# Patient Record
Sex: Female | Born: 1995 | Race: White | Hispanic: No | Marital: Single | Smoking: Current every day smoker
Health system: Southern US, Community
[De-identification: ages and names within clinical notes are randomized; demographics above are authoritative.]

---
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
## 2017-05-13 ENCOUNTER — Emergency Department
Admission: EM | Admit: 2017-05-13 | Discharge: 2017-05-13 | Disposition: A | Discharge status: Home / Self Care | Payer: Self-pay | Attending: Emergency Medicine | Admitting: Emergency Medicine

## 2017-05-13 DIAGNOSIS — F410 Panic disorder [episodic paroxysmal anxiety] without agoraphobia: Secondary | ICD-10-CM | POA: Insufficient documentation

## 2017-05-13 DIAGNOSIS — Z87891 Personal history of nicotine dependence: Secondary | ICD-10-CM | POA: Insufficient documentation

## 2017-05-13 NOTE — Discharge Instructions (Signed)
You have been seen in the Emergency Department (ED) today for chest pain.  We feel it is likely that panic attacks may be causing your symptoms.  Please follow up with the recommended doctor as instructed above in these documents regarding today?s emergent visit and your recent symptoms to discuss further management.   Return to the Emergency Department (ED) if you experience any further chest pain/pressure/tightness, difficulty breathing, or sudden sweating, or other symptoms that concern you.

## 2017-05-13 NOTE — ED Triage Notes (Signed)
Pt comes into the ED via [facility] from work c/o panic attack.  Patient states she is unsure what caused the initial panic attack and that she has numbness and tingling in her hands.  Patient presents at this time hyperventilating.  Patient had 3 episodes of syncope from the episode.  Patient now able to form full sentences and speak clearly.

## 2017-05-13 NOTE — ED Triage Notes (Signed)
Pt arrived via ems from work. EMS unsure of cause, episodes of passing out x 3. Locked hands, numbness and tingling, facial numbness and tingling. Hx of attacks in past. Pt in no obvious distress.

## 2017-05-13 NOTE — ED Provider Notes (Signed)
[facility] Emergency Department Provider Note  ____________________________________________   First MD Initiated Contact with Patient 05/13/17 1858     (approximate)  I have reviewed the triage vital signs and the nursing notes.   HISTORY  Chief Complaint Panic Attack    HPI Jennifer Faulkner is a 21 y.o. female with medical history as listed below who presents with her mother and grandmother for evaluation of tach.  She states she is she was having a normal day without any particular stressors or "bad things".  She was having a conversation with someone that was not particularly contentious when she started to feel short of breath and then started feeling like "my chest was exploding".  She was hyperventilating and started feeling numbness and tingling in her hands and feet.  When she arrived in triage she was having trouble walking because of the hyperventilation and was having some mild contractures of her hands and fingers.  She calmed down after arriving in triage and her symptoms started to resolve and she was able to speak in full sentences.  Reportedly she passed out several times while she was hyperventilating.  He was having some facial numbness.  All of the symptoms have completely resolved.  The onset was acute and the symptoms were severe.  Nothing in particular made them better nor worse, but everything improved dramatically and then resolved after she calmed down and stopped breathing so quickly.  She denies any recent abuse or "incidents".  She denies any recent drug use although she does admit that she smoked crack a few months ago but states she has been clean since that time.  She denies IV drug use.  She is not on any medications and has had no recent medication changes.  She had a psychiatrist in the past with whom she had a good rapport, but that psychiatrist moved away and she never developed a relationship with anyone else after that.  She is  living with her grandmother currently and working at a daycare facility and there is some stress as a result of that situation, but her family is supportive and at the bedside with her.  She adamantly denies any suicidal ideation or thoughts of harming anyone else.  History reviewed. No pertinent past medical history.  Patient Active Problem List   Diagnosis Date Noted  . Acute toxic hepatitis 04/06/2015  . Hypokalemia 04/06/2015  . Marijuana abuse 04/06/2015  . Alcohol abuse 04/06/2015  . 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 Tylenol overdose 04/04/2015    History reviewed. No pertinent surgical history.  Prior to Admission medications   Medication Sig Start Date End Date Taking? Authorizing Provider  ondansetron (ZOFRAN ODT) 4 MG disintegrating tablet Take 1 tablet (4 mg total) by mouth every 8 (eight) hours as needed for nausea or vomiting. 04/05/15   Enid Baas, MD    Allergies Patient has no known allergies.  Family History  Problem Relation Age of Onset  . Brain cancer Unknown        Great grandmother  . Anxiety disorder Father     Social History Social History  Substance Use Topics  . Smoking status: Former Games developer  . Smokeless tobacco: Not on file     Comment: 2-3 cigarettes /day  . Alcohol use No    Review of Systems Constitutional: No fever/chills Eyes: No visual changes. ENT: No sore throat. Cardiovascular: Denies chest pain. Respiratory: Denies shortness of breath. Gastrointestinal: No abdominal pain.  No nausea, no vomiting.  No diarrhea.  No constipation. Genitourinary: Negative for dysuria. Musculoskeletal: Negative for neck pain.  Negative for back pain. Integumentary: Negative for rash. Neurological: Negative for headaches, focal weakness or numbness. Psychiatric:  ____________________________________________   PHYSICAL EXAM:  VITAL SIGNS: ED Triage Vitals  Enc Vitals Group     BP 05/13/17 1807 (!)  143/106     Pulse Rate 05/13/17 1807 (!) 121     Resp 05/13/17 1807 (!) 33     Temp --      Temp src --      SpO2 05/13/17 1807 100 %     Weight 05/13/17 1808 56.7 kg (125 lb)     Height 05/13/17 1808 1.626 m ( )     Head Circumference --      Peak Flow --      Pain Score 05/13/17 1805 0     Pain Loc --      Pain Edu? --      Excl. in [place]? --     Constitutional: Alert and oriented. Well appearing and in no acute distress. Eyes: Conjunctivae are normal.  Head: Atraumatic. Nose: No congestion/rhinnorhea. Mouth/Throat: Mucous membranes are moist. Neck: No stridor.  No meningeal signs.   Cardiovascular: Normal rate, regular rhythm. Good peripheral circulation. Grossly normal heart sounds. Respiratory: Normal respiratory effort.  No retractions. Lungs CTAB. Gastrointestinal: Soft and nontender. No distention.  Musculoskeletal: No lower extremity tenderness nor edema. No gross deformities of extremities. Neurologic:  Normal speech and language. No gross focal neurologic deficits are appreciated.  Skin:  Skin is warm, dry and intact. No rash noted. Psychiatric: Mood and affect are normal. Speech and behavior are normal.  She is calm and cooperative at this time, speaking in full sentences, and adamantly denies any suicidal ideation or thoughts of harming is good insight and judgment to her situation  ____________________________________________   LABS (all labs ordered are listed, but only abnormal results are displayed)  Labs Reviewed - No data to display ____________________________________________  EKG  None - EKG not ordered by ED physician ____________________________________________  RADIOLOGY   No results found.  ____________________________________________   PROCEDURES  Critical Care performed: No   Procedure(s) performed:   Procedures   ____________________________________________   INITIAL IMPRESSION / ASSESSMENT AND PLAN / ED COURSE  As part of  my medical decision making, I reviewed the following data within the electronic MEDICAL RECORD NUMBER History obtained from family and Nursing notes reviewed and incorporated    I had a long talk with the patient in the presence of her mother and grandmother, then we had them stepped out and 1 of her female nurses came into the room as a chaperone for further private discussion.  The patient denies any recent abuse or assault and again denies any thoughts of harming herself or anyone else.  She reports that she has been struggling with some anxiety and depression all her life and that it runs very strongly in her family.  She is worried about the cost of following up with an outpatient provider but recognizes it would likely be beneficial for her.  I do not feel she would benefit from psychiatric evaluation at this time and the patient is adamant that she wants to go home.  She does not meet criteria for involuntary commitment nor for inpatient psychiatric treatment.  I do not think she would benefit from lab work or imaging at this time as her signs and symptoms are very  consistent with panic attack.  I counseled her extensively about the benefit of outpatient follow-up.  I explained we do not typically start patients on psychiatric medications in the emergency department.  I updated her grandmother but maintained her confidentiality, just explaining that I strongly feel she would benefit for outpatient follow-up.  Her grandmother says she will help her in any way she can.  I gave my usual and customary return precautions.     ____________________________________________  FINAL CLINICAL IMPRESSION(S) / ED DIAGNOSES  Final diagnoses:  Panic attack     MEDICATIONS GIVEN DURING THIS VISIT:  Medications - No data to display   NEW OUTPATIENT MEDICATIONS STARTED DURING THIS VISIT:  New Prescriptions   No medications on file    Modified Medications   No medications on file    Discontinued  Medications   No medications on file     Note:  This document was prepared using Dragon voice recognition software and may include unintentional dictation errors.    Loleta Rose, MD 05/13/17 [phone]

## 2017-05-13 NOTE — ED Notes (Signed)
ED Provider at bedside with Hilbert Corrigan, RN

## 2020-01-11 ENCOUNTER — Encounter: Payer: Self-pay | Admitting: Emergency Medicine

## 2020-01-11 ENCOUNTER — Ambulatory Visit
Admission: EM | Admit: 2020-01-11 | Discharge: 2020-01-11 | Disposition: A | Discharge status: Home / Self Care | Payer: Self-pay | Attending: Family Medicine | Admitting: Family Medicine

## 2020-01-11 ENCOUNTER — Other Ambulatory Visit: Payer: Self-pay

## 2020-01-11 DIAGNOSIS — N946 Dysmenorrhea, unspecified: Secondary | ICD-10-CM

## 2020-01-11 MED ORDER — KETOROLAC TROMETHAMINE 10 MG PO TABS: 10.0000 mg | ORAL_TABLET | Freq: Four times a day (QID) | ORAL | 0 refills | Status: AC | PRN

## 2020-01-11 MED ORDER — KETOROLAC TROMETHAMINE 60 MG/2ML IM SOLN
60.0000 mg | Freq: Once | INTRAMUSCULAR | Status: AC
  Administered 2020-01-11: 60 mg via INTRAMUSCULAR

## 2020-01-11 NOTE — ED Provider Notes (Signed)
[facility]    CSN: [id number] Arrival date & time: 01/11/20  1131      History   Chief Complaint Chief Complaint  Patient presents with   menstrual cramping   HPI  24 year old female presents with the above complaints.  Patient reports that she started her menstrual cycle this morning.  She states that she is having severe lower abdominal cramping.  She states that this is a difficult of her menstrual cycles.  Rates her pain as 8/10 in severity.  No relieving factors.  No other associated symptoms.  No other complaints or concerns at this time.  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    Home Medications    Prior to Admission medications   Medication Sig Start Date End Date Taking? Authorizing Provider  ketorolac (TORADOL) 10 MG tablet Take 1 tablet (10 mg total) by mouth every 6 (six) hours as needed for moderate pain or severe pain. 01/11/20   Coral Spikes, DO    Family History Family History  Problem Relation Age of Onset   Brain cancer Other        Great grandmother   Anxiety disorder Father     Social History Social History   Tobacco Use   Smoking status: Current Every Day Smoker   Smokeless tobacco: Never Used   Tobacco comment: 2-3 cigarettes /day  Substance Use Topics   Alcohol use: Yes    Alcohol/week: 0.0 standard drinks   Drug use: Yes    Types: Marijuana    Comment: Marijuana- last use 1 month ago     Allergies   Patient has no known allergies.   Review of Systems Review of Systems  Constitutional: Negative.   Genitourinary: Positive for pelvic pain.   Physical Exam Triage Vital Signs ED Triage Vitals  Enc Vitals Group     BP 01/11/20 1148  120/75     Pulse Rate 01/11/20 1148 71     Resp 01/11/20 1148 18     Temp 01/11/20 1148 98.2 F (36.8 C)     Temp Source 01/11/20 1148 Oral     SpO2 01/11/20 1148 100 %     Weight 01/11/20 1145 122 lb (55.3 kg)     Height 01/11/20 1145 5\' 3"  (1.6 m)     Head Circumference --      Peak Flow --      Pain Score 01/11/20 1145 8     Pain Loc --      Pain Edu? --      Excl. in [place]? --    Updated Vital Signs BP 120/75 (BP Location: Right Arm)    Pulse 71    Temp 98.2 F (36.8 C) (Oral)    Resp 18    Ht 5\' 3"  (1.6 m)    Wt 55.3 kg    LMP 01/11/2020    SpO2 100%    BMI 21.61 kg/m   Visual Acuity Right Eye Distance:   Left Eye Distance:   Bilateral Distance:    Right Eye Near:   Left Eye Near:    Bilateral Near:     Physical Exam Vitals and nursing note reviewed.  Constitutional:  General: She is not in acute distress.    Appearance: Normal appearance. She is not ill-appearing.  HENT:     Head: Normocephalic and atraumatic.  Eyes:     General:        Right eye: No discharge.        Left eye: No discharge.     Conjunctiva/sclera: Conjunctivae normal.  Cardiovascular:     Rate and Rhythm: Normal rate and regular rhythm.     Heart sounds: No murmur.  Pulmonary:     Effort: Pulmonary effort is normal.     Breath sounds: Normal breath sounds. No wheezing, rhonchi or rales.  Abdominal:     General: There is no distension.     Palpations: Abdomen is soft.     Tenderness: There is no abdominal tenderness.  Neurological:     Mental Status: She is alert.  Psychiatric:        Mood and Affect: Mood normal.        Behavior: Behavior normal.    UC Treatments / Results  Labs (all labs ordered are listed, but only abnormal results are displayed) Labs Reviewed - No data to display  EKG   Radiology No results found.  Procedures Procedures (including critical care time)  Medications Ordered in UC Medications  ketorolac (TORADOL) injection 60 mg (60 mg Intramuscular  Given 01/11/20 1202)    Initial Impression / Assessment and Plan / UC Course  I have reviewed the triage vital signs and the nursing notes.  Pertinent labs & imaging results that were available during my care of the patient were reviewed by me and considered in my medical decision making (see chart for details).    24 year old female presents with dysmenorrhea.  Treating with Toradol.  Final Clinical Impressions(s) / UC Diagnoses   Final diagnoses:  Dysmenorrhea     Discharge Instructions     Rest.  Heating pad.  Medication as directed (don't take a dose until later this afternoon).  Take care  Dr. Adriana Simas    ED Prescriptions    Medication Sig Dispense Auth. Provider   ketorolac (TORADOL) 10 MG tablet Take 1 tablet (10 mg total) by mouth every 6 (six) hours as needed for moderate pain or severe pain. 20 tablet Tommie Sams, DO     PDMP not reviewed this encounter.   Tommie Sams, DO 01/11/20 1224

## 2020-01-11 NOTE — ED Triage Notes (Signed)
Patient started her menstrual cycle this morning and she states she is having bad menstrual cramping.

## 2020-01-11 NOTE — Discharge Instructions (Signed)
Rest.  Heating pad.  Medication as directed (don't take a dose until later this afternoon).  Take care  Dr. Adriana Simas
